# Patient Record
Sex: Female | Born: 1945 | Race: White | Hispanic: No | Marital: Married | Smoking: Never smoker
Health system: Southern US, Community
[De-identification: ages and names within clinical notes are randomized; demographics above are authoritative.]

## PROBLEM LIST (undated history)

## (undated) DIAGNOSIS — R5383 Other fatigue: Secondary | ICD-10-CM

## (undated) DIAGNOSIS — K635 Polyp of colon: Secondary | ICD-10-CM

## (undated) DIAGNOSIS — R197 Diarrhea, unspecified: Secondary | ICD-10-CM

## (undated) DIAGNOSIS — R61 Generalized hyperhidrosis: Secondary | ICD-10-CM

## (undated) DIAGNOSIS — N63 Unspecified lump in unspecified breast: Secondary | ICD-10-CM

## (undated) DIAGNOSIS — C801 Malignant (primary) neoplasm, unspecified: Secondary | ICD-10-CM

## (undated) DIAGNOSIS — R52 Pain, unspecified: Secondary | ICD-10-CM

## (undated) DIAGNOSIS — Z973 Presence of spectacles and contact lenses: Secondary | ICD-10-CM

## (undated) DIAGNOSIS — D0510 Intraductal carcinoma in situ of unspecified breast: Secondary | ICD-10-CM

## (undated) DIAGNOSIS — E785 Hyperlipidemia, unspecified: Secondary | ICD-10-CM

## (undated) DIAGNOSIS — K469 Unspecified abdominal hernia without obstruction or gangrene: Secondary | ICD-10-CM

## (undated) DIAGNOSIS — R112 Nausea with vomiting, unspecified: Secondary | ICD-10-CM

## (undated) HISTORY — DX: Presence of spectacles and contact lenses: Z97.3

## (undated) HISTORY — DX: Intraductal carcinoma in situ of unspecified breast: D05.10

## (undated) HISTORY — DX: Pain, unspecified: R52

## (undated) HISTORY — DX: Diarrhea, unspecified: R19.7

## (undated) HISTORY — DX: Hyperlipidemia, unspecified: E78.5

## (undated) HISTORY — PX: BREAST SURGERY: SHX581

## (undated) HISTORY — DX: Other fatigue: R53.83

## (undated) HISTORY — DX: Unspecified lump in unspecified breast: N63.0

## (undated) HISTORY — PX: ABDOMINAL HYSTERECTOMY: SHX81

## (undated) HISTORY — PX: OTHER SURGICAL HISTORY: SHX169

## (undated) HISTORY — DX: Nausea with vomiting, unspecified: R11.2

## (undated) HISTORY — DX: Unspecified abdominal hernia without obstruction or gangrene: K46.9

## (undated) HISTORY — DX: Polyp of colon: K63.5

## (undated) HISTORY — DX: Generalized hyperhidrosis: R61

## (undated) HISTORY — DX: Malignant (primary) neoplasm, unspecified: C80.1

## (undated) HISTORY — PX: TRIGGER FINGER RELEASE: SHX641

---

## 2010-03-11 ENCOUNTER — Ambulatory Visit: Admission: RE | Admit: 2010-03-11 | Discharge: 2010-05-16 | Payer: Self-pay | Admitting: Radiation Oncology

## 2010-04-10 ENCOUNTER — Ambulatory Visit: Payer: Self-pay | Admitting: Oncology

## 2010-04-14 LAB — COMPREHENSIVE METABOLIC PANEL
Albumin: 4.3 g/dL (ref 3.5–5.2)
BUN: 14 mg/dL (ref 6–23)
Calcium: 9.3 mg/dL (ref 8.4–10.5)
Chloride: 106 mEq/L (ref 96–112)
Creatinine, Ser: 0.71 mg/dL (ref 0.40–1.20)
Glucose, Bld: 107 mg/dL — ABNORMAL HIGH (ref 70–99)
Potassium: 4.2 mEq/L (ref 3.5–5.3)

## 2010-04-14 LAB — CBC WITH DIFFERENTIAL/PLATELET
Basophils Absolute: 0 10*3/uL (ref 0.0–0.1)
Eosinophils Absolute: 0.2 10*3/uL (ref 0.0–0.5)
HCT: 41.5 % (ref 34.8–46.6)
HGB: 14.1 g/dL (ref 11.6–15.9)
MCV: 89.5 fL (ref 79.5–101.0)
MONO%: 9.4 % (ref 0.0–14.0)
NEUT#: 2.1 10*3/uL (ref 1.5–6.5)
NEUT%: 52.7 % (ref 38.4–76.8)
RDW: 12.9 % (ref 11.2–14.5)

## 2010-05-29 ENCOUNTER — Ambulatory Visit: Payer: Self-pay | Admitting: Oncology

## 2010-06-25 LAB — COMPREHENSIVE METABOLIC PANEL
AST: 27 U/L (ref 0–37)
Albumin: 4.7 g/dL (ref 3.5–5.2)
BUN: 14 mg/dL (ref 6–23)
CO2: 27 mEq/L (ref 19–32)
Calcium: 9.5 mg/dL (ref 8.4–10.5)
Chloride: 105 mEq/L (ref 96–112)
Glucose, Bld: 81 mg/dL (ref 70–99)
Potassium: 4.3 mEq/L (ref 3.5–5.3)

## 2010-06-25 LAB — CBC WITH DIFFERENTIAL/PLATELET
Basophils Absolute: 0 10*3/uL (ref 0.0–0.1)
Eosinophils Absolute: 0.1 10*3/uL (ref 0.0–0.5)
HGB: 14.3 g/dL (ref 11.6–15.9)
MONO#: 0.5 10*3/uL (ref 0.1–0.9)
NEUT#: 2.2 10*3/uL (ref 1.5–6.5)
RDW: 12.8 % (ref 11.2–14.5)
lymph#: 1.1 10*3/uL (ref 0.9–3.3)

## 2010-07-03 ENCOUNTER — Ambulatory Visit: Payer: Self-pay | Admitting: Oncology

## 2010-10-09 ENCOUNTER — Other Ambulatory Visit: Payer: Self-pay | Admitting: Oncology

## 2010-10-09 ENCOUNTER — Encounter (HOSPITAL_BASED_OUTPATIENT_CLINIC_OR_DEPARTMENT_OTHER): Payer: BC Managed Care – PPO | Admitting: Oncology

## 2010-10-09 DIAGNOSIS — Z780 Asymptomatic menopausal state: Secondary | ICD-10-CM

## 2010-10-09 DIAGNOSIS — Z801 Family history of malignant neoplasm of trachea, bronchus and lung: Secondary | ICD-10-CM

## 2010-10-09 DIAGNOSIS — K219 Gastro-esophageal reflux disease without esophagitis: Secondary | ICD-10-CM

## 2010-10-09 DIAGNOSIS — Z853 Personal history of malignant neoplasm of breast: Secondary | ICD-10-CM

## 2010-10-09 DIAGNOSIS — Z800 Family history of malignant neoplasm of digestive organs: Secondary | ICD-10-CM

## 2010-10-09 DIAGNOSIS — C50219 Malignant neoplasm of upper-inner quadrant of unspecified female breast: Secondary | ICD-10-CM

## 2010-10-09 DIAGNOSIS — Z1231 Encounter for screening mammogram for malignant neoplasm of breast: Secondary | ICD-10-CM

## 2010-10-09 LAB — COMPREHENSIVE METABOLIC PANEL
ALT: 24 U/L (ref 0–35)
AST: 21 U/L (ref 0–37)
Albumin: 4.6 g/dL (ref 3.5–5.2)
BUN: 19 mg/dL (ref 6–23)
Calcium: 9.7 mg/dL (ref 8.4–10.5)
Chloride: 103 mEq/L (ref 96–112)
Potassium: 3.9 mEq/L (ref 3.5–5.3)
Total Protein: 7.1 g/dL (ref 6.0–8.3)

## 2010-10-09 LAB — CBC WITH DIFFERENTIAL/PLATELET
Basophils Absolute: 0 10*3/uL (ref 0.0–0.1)
EOS%: 1.7 % (ref 0.0–7.0)
HGB: 14.4 g/dL (ref 11.6–15.9)
MCH: 29.6 pg (ref 25.1–34.0)
NEUT#: 2.1 10*3/uL (ref 1.5–6.5)
RDW: 13.2 % (ref 11.2–14.5)
lymph#: 1.6 10*3/uL (ref 0.9–3.3)

## 2010-10-15 ENCOUNTER — Other Ambulatory Visit: Payer: BC Managed Care – PPO

## 2010-10-21 ENCOUNTER — Ambulatory Visit
Admission: RE | Admit: 2010-10-21 | Discharge: 2010-10-21 | Disposition: A | Discharge status: Home / Self Care | Payer: BC Managed Care – PPO | Source: Ambulatory Visit | Attending: Oncology | Admitting: Oncology

## 2010-10-21 DIAGNOSIS — Z853 Personal history of malignant neoplasm of breast: Secondary | ICD-10-CM

## 2010-10-21 DIAGNOSIS — Z780 Asymptomatic menopausal state: Secondary | ICD-10-CM

## 2011-01-22 ENCOUNTER — Other Ambulatory Visit: Payer: Self-pay | Admitting: Oncology

## 2011-01-22 ENCOUNTER — Encounter (HOSPITAL_BASED_OUTPATIENT_CLINIC_OR_DEPARTMENT_OTHER): Payer: BC Managed Care – PPO | Admitting: Oncology

## 2011-01-22 DIAGNOSIS — C50219 Malignant neoplasm of upper-inner quadrant of unspecified female breast: Secondary | ICD-10-CM

## 2011-01-22 DIAGNOSIS — Z800 Family history of malignant neoplasm of digestive organs: Secondary | ICD-10-CM

## 2011-01-22 DIAGNOSIS — K219 Gastro-esophageal reflux disease without esophagitis: Secondary | ICD-10-CM

## 2011-01-22 DIAGNOSIS — Z801 Family history of malignant neoplasm of trachea, bronchus and lung: Secondary | ICD-10-CM

## 2011-01-22 LAB — CBC WITH DIFFERENTIAL/PLATELET
BASO%: 0.4 % (ref 0.0–2.0)
Basophils Absolute: 0 10*3/uL (ref 0.0–0.1)
EOS%: 2.3 % (ref 0.0–7.0)
Eosinophils Absolute: 0.1 10*3/uL (ref 0.0–0.5)
HCT: 40.5 % (ref 34.8–46.6)
HGB: 13.9 g/dL (ref 11.6–15.9)
LYMPH%: 38.2 % (ref 14.0–49.7)
MCH: 30.9 pg (ref 25.1–34.0)
MCHC: 34.4 g/dL (ref 31.5–36.0)
MCV: 89.8 fL (ref 79.5–101.0)
MONO#: 0.4 10*3/uL (ref 0.1–0.9)
MONO%: 10.7 % (ref 0.0–14.0)
NEUT#: 2 10*3/uL (ref 1.5–6.5)
NEUT%: 48.4 % (ref 38.4–76.8)
Platelets: 213 10*3/uL (ref 145–400)
RBC: 4.52 10*6/uL (ref 3.70–5.45)
RDW: 12.7 % (ref 11.2–14.5)
WBC: 4.2 10*3/uL (ref 3.9–10.3)
lymph#: 1.6 10*3/uL (ref 0.9–3.3)

## 2011-01-22 LAB — COMPREHENSIVE METABOLIC PANEL WITH GFR
ALT: 27 U/L (ref 0–35)
AST: 23 U/L (ref 0–37)
Albumin: 4.4 g/dL (ref 3.5–5.2)
Alkaline Phosphatase: 64 U/L (ref 39–117)
BUN: 16 mg/dL (ref 6–23)
CO2: 28 meq/L (ref 19–32)
Calcium: 9.5 mg/dL (ref 8.4–10.5)
Chloride: 104 meq/L (ref 96–112)
Creatinine, Ser: 0.71 mg/dL (ref 0.50–1.10)
Glucose, Bld: 91 mg/dL (ref 70–99)
Potassium: 4.1 meq/L (ref 3.5–5.3)
Sodium: 141 meq/L (ref 135–145)
Total Bilirubin: 0.4 mg/dL (ref 0.3–1.2)
Total Protein: 6.4 g/dL (ref 6.0–8.3)

## 2011-02-03 ENCOUNTER — Ambulatory Visit (INDEPENDENT_AMBULATORY_CARE_PROVIDER_SITE_OTHER): Payer: BC Managed Care – PPO | Admitting: General Surgery

## 2011-02-03 ENCOUNTER — Encounter (INDEPENDENT_AMBULATORY_CARE_PROVIDER_SITE_OTHER): Payer: Self-pay | Admitting: General Surgery

## 2011-02-03 VITALS — BP 134/80 | HR 80 | Temp 96.8°F | Ht 61.5 in | Wt 122.0 lb

## 2011-02-03 DIAGNOSIS — R220 Localized swelling, mass and lump, head: Secondary | ICD-10-CM

## 2011-02-03 NOTE — Progress Notes (Signed)
Subjective:     Patient ID: Debra Mccarthy, female   DOB: 1946/03/26, 65 y.o.   MRN: [id number]  HPI The patient comes in with a complaint of a notch on the right upper aspect of her suprasternal notch which has been there since the Saturday following her Friday. 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 This mass has been noted since April and has enlarged in size since then. It is occasionally tender but probably is gone more tender with recurrent palpation she's had no symptoms involved with it. No prior biopsies had been done a CT scan was done along with a chest x-ray and by the report given no significant masses had been seen  Review of Systems  Constitutional: Negative.   HENT: Positive for neck pain (right suprasternal notch area).   Eyes: Negative.   Respiratory: Negative.   Cardiovascular: Negative.   Gastrointestinal: Negative.   Genitourinary: Negative.   Neurological: Negative.   Hematological: Negative.   Psychiatric/Behavioral: Negative.        Objective:   Physical Exam  Constitutional: She is oriented to person, place, and time. She appears well-developed and well-nourished.  HENT:  Head: Normocephalic and atraumatic.  Eyes: Pupils are equal, round, and reactive to light.  Neck: Normal range of motion. No rigidity. Mass (not thyroid, right suprasternal) present.    Musculoskeletal: Normal range of motion.  Neurological: She is alert and oriented to person, place, and time. She has normal reflexes.  Skin: Skin is warm and dry.  Psychiatric: She has a normal mood and affect. Her behavior is normal. Judgment normal.       Assessment:     The patient has a mobile soft mildly tender right suprasternal notch the which is amenable to biopsy however on reluctant to do so until I have had a chance to review the CT scan of her neck that was done in April of 2012. This x-ray was done outside of the  hospital system therefore I will meet with the patient and her husband to bring the disc at trial office were reviewed.  They are going on vacation this week and for about a week and I will see him back in about 2 weeks for followup and discussion of possible outpatient surgical biopsy    Plan:     I will give the patient's OCD that contains a CT scan of her neck in April. We then did have one dose available when I see the patient again in approximately 2 weeks.

## 2011-02-12 ENCOUNTER — Ambulatory Visit: Payer: BC Managed Care – PPO | Admitting: Radiation Oncology

## 2011-03-03 ENCOUNTER — Encounter (INDEPENDENT_AMBULATORY_CARE_PROVIDER_SITE_OTHER): Payer: Self-pay | Admitting: General Surgery

## 2011-03-03 ENCOUNTER — Ambulatory Visit (INDEPENDENT_AMBULATORY_CARE_PROVIDER_SITE_OTHER): Payer: BC Managed Care – PPO | Admitting: General Surgery

## 2011-03-03 DIAGNOSIS — R220 Localized swelling, mass and lump, head: Secondary | ICD-10-CM

## 2011-03-03 DIAGNOSIS — R221 Localized swelling, mass and lump, neck: Secondary | ICD-10-CM

## 2011-03-03 NOTE — Progress Notes (Signed)
HPI After the patient's last visit I was able to review the previous radiologic studies done on her chest demonstrating this suprasternal mass in from the look of it on these studies it is a benign process soft tissue process to down it does not appear to be a cyst. Possibly a lipoma or something related to the sternocleidomastoid muscle.  PE The mass in the suprasternal notch is just to the right of the notch behind the sternal head of the sternocleidomastoid muscle. It is much more prominent in the upright position and induced with the patient in the supine position. It is somewhat tender to palpation there appears to be about 1-1/2 cm in size. It is mobile.  Studiy review I reviewed the patient's previous scans demonstrate this soft tissue mass in the suprasternal notch it does not appear to be a significant process  Assessment Benign soft tissue growth in the suprasternal notch  Plan Reassess the patient in 3 months and if it has grown in size and consider excisional biopsy.

## 2011-04-13 ENCOUNTER — Other Ambulatory Visit: Payer: Self-pay | Admitting: Physician Assistant

## 2011-04-13 ENCOUNTER — Encounter (HOSPITAL_BASED_OUTPATIENT_CLINIC_OR_DEPARTMENT_OTHER): Payer: BC Managed Care – PPO | Admitting: Oncology

## 2011-04-13 DIAGNOSIS — R232 Flushing: Secondary | ICD-10-CM

## 2011-04-13 DIAGNOSIS — C50919 Malignant neoplasm of unspecified site of unspecified female breast: Secondary | ICD-10-CM

## 2011-04-13 DIAGNOSIS — Z923 Personal history of irradiation: Secondary | ICD-10-CM

## 2011-04-13 DIAGNOSIS — K219 Gastro-esophageal reflux disease without esophagitis: Secondary | ICD-10-CM

## 2011-04-13 DIAGNOSIS — Z803 Family history of malignant neoplasm of breast: Secondary | ICD-10-CM

## 2011-04-13 LAB — CBC WITH DIFFERENTIAL/PLATELET
BASO%: 0.3 % (ref 0.0–2.0)
EOS%: 2.3 % (ref 0.0–7.0)
HCT: 36.1 % (ref 34.8–46.6)
LYMPH%: 31.5 % (ref 14.0–49.7)
MCH: 31.7 pg (ref 25.1–34.0)
MCHC: 34.8 g/dL (ref 31.5–36.0)
MCV: 91.2 fL (ref 79.5–101.0)
MONO%: 9.5 % (ref 0.0–14.0)
NEUT%: 56.4 % (ref 38.4–76.8)
Platelets: 214 10*3/uL (ref 145–400)

## 2011-04-13 LAB — COMPREHENSIVE METABOLIC PANEL
ALT: 13 U/L (ref 0–35)
AST: 17 U/L (ref 0–37)
BUN: 14 mg/dL (ref 6–23)
Creatinine, Ser: 0.74 mg/dL (ref 0.50–1.10)
Total Bilirubin: 0.3 mg/dL (ref 0.3–1.2)

## 2011-05-05 ENCOUNTER — Ambulatory Visit: Payer: BC Managed Care – PPO

## 2011-05-05 ENCOUNTER — Ambulatory Visit
Admission: RE | Admit: 2011-05-05 | Discharge: 2011-05-05 | Disposition: A | Discharge status: Home / Self Care | Payer: BC Managed Care – PPO | Source: Ambulatory Visit | Attending: Oncology | Admitting: Oncology

## 2011-05-05 ENCOUNTER — Other Ambulatory Visit: Payer: Self-pay | Admitting: Oncology

## 2011-05-05 ENCOUNTER — Other Ambulatory Visit: Payer: Self-pay | Admitting: *Deleted

## 2011-05-05 DIAGNOSIS — Z1231 Encounter for screening mammogram for malignant neoplasm of breast: Secondary | ICD-10-CM

## 2011-05-18 ENCOUNTER — Encounter (INDEPENDENT_AMBULATORY_CARE_PROVIDER_SITE_OTHER): Payer: Self-pay | Admitting: General Surgery

## 2011-05-19 ENCOUNTER — Other Ambulatory Visit: Payer: Self-pay | Admitting: Oncology

## 2011-05-19 DIAGNOSIS — Z9889 Other specified postprocedural states: Secondary | ICD-10-CM

## 2011-05-19 DIAGNOSIS — Z853 Personal history of malignant neoplasm of breast: Secondary | ICD-10-CM

## 2011-05-25 ENCOUNTER — Ambulatory Visit
Admission: RE | Admit: 2011-05-25 | Discharge: 2011-05-25 | Disposition: A | Discharge status: Home / Self Care | Payer: BC Managed Care – PPO | Source: Ambulatory Visit | Attending: Oncology | Admitting: Oncology

## 2011-05-25 ENCOUNTER — Other Ambulatory Visit: Payer: Self-pay | Admitting: Oncology

## 2011-05-25 DIAGNOSIS — Z9889 Other specified postprocedural states: Secondary | ICD-10-CM

## 2011-05-25 DIAGNOSIS — Z853 Personal history of malignant neoplasm of breast: Secondary | ICD-10-CM

## 2011-05-25 DIAGNOSIS — R928 Other abnormal and inconclusive findings on diagnostic imaging of breast: Secondary | ICD-10-CM

## 2011-05-25 MED ORDER — GADOBENATE DIMEGLUMINE 529 MG/ML IV SOLN
11.0000 mL | Freq: Once | INTRAVENOUS | Status: AC | PRN
  Administered 2011-05-25: 11 mL via INTRAVENOUS

## 2011-05-26 ENCOUNTER — Other Ambulatory Visit: Payer: Self-pay | Admitting: Oncology

## 2011-05-26 ENCOUNTER — Encounter: Payer: Self-pay | Admitting: Oncology

## 2011-05-26 ENCOUNTER — Telehealth: Payer: Self-pay | Admitting: *Deleted

## 2011-05-26 DIAGNOSIS — D0510 Intraductal carcinoma in situ of unspecified breast: Secondary | ICD-10-CM

## 2011-05-26 HISTORY — DX: Intraductal carcinoma in situ of unspecified breast: D05.10

## 2011-05-26 NOTE — Telephone Encounter (Signed)
ok 

## 2011-05-26 NOTE — Telephone Encounter (Signed)
Per MD, no need to see pt today. Pt will have an ultrasound 05/27/11 at 1000. MD will follow up after scan. Pt has been notifed and verbalizes understanding

## 2011-05-26 NOTE — Telephone Encounter (Signed)
Made patient appointment for 06-15-2011 at 2:00

## 2011-05-26 NOTE — Telephone Encounter (Signed)
Per MD, pt has been notified that she is to call this desk for appt with MD to discuss scan

## 2011-05-28 ENCOUNTER — Ambulatory Visit
Admission: RE | Admit: 2011-05-28 | Discharge: 2011-05-28 | Disposition: A | Discharge status: Home / Self Care | Payer: BC Managed Care – PPO | Source: Ambulatory Visit | Attending: Oncology | Admitting: Oncology

## 2011-05-28 ENCOUNTER — Ambulatory Visit: Payer: BC Managed Care – PPO

## 2011-05-28 DIAGNOSIS — R928 Other abnormal and inconclusive findings on diagnostic imaging of breast: Secondary | ICD-10-CM

## 2011-06-12 ENCOUNTER — Encounter: Payer: Self-pay | Admitting: *Deleted

## 2011-06-12 ENCOUNTER — Telehealth: Payer: Self-pay | Admitting: *Deleted

## 2011-06-12 NOTE — Telephone Encounter (Signed)
Pt called to advised " I would like to reschedule appt 11/26 for another time as I'm [facility] most recent scan to the ones I had at [facility]. They said the marker is so close to the spot they want to compare from the last X-rays. [facility] is supposed to send them the X-rays so that's what I'm waiting on. They are going to call me once they get them and tell me. Until then I dont' think I need to see Dr. Welton Flakes so I'd like to reschedule my appt until after the X-rays are compared. I will call you and let you know." Informed pt I would give Dr. Welton Flakes the message on Monday as she is out of office today.

## 2011-06-13 ENCOUNTER — Telehealth: Payer: Self-pay | Admitting: Oncology

## 2011-06-13 NOTE — Telephone Encounter (Signed)
Cancelled appt per Mitchell Heir, per pt rqst

## 2011-06-15 ENCOUNTER — Ambulatory Visit: Payer: BC Managed Care – PPO | Admitting: Oncology

## 2011-06-28 ENCOUNTER — Other Ambulatory Visit: Payer: Self-pay | Admitting: Oncology

## 2011-06-28 DIAGNOSIS — D0510 Intraductal carcinoma in situ of unspecified breast: Secondary | ICD-10-CM

## 2011-06-29 ENCOUNTER — Other Ambulatory Visit: Payer: Self-pay

## 2011-06-29 DIAGNOSIS — D0510 Intraductal carcinoma in situ of unspecified breast: Secondary | ICD-10-CM

## 2011-06-29 MED ORDER — TAMOXIFEN CITRATE 20 MG PO TABS: 20.0000 mg | ORAL_TABLET | Freq: Every day | ORAL | Status: AC

## 2011-06-30 ENCOUNTER — Other Ambulatory Visit: Payer: Self-pay | Admitting: Oncology

## 2011-06-30 ENCOUNTER — Telehealth: Payer: Self-pay | Admitting: *Deleted

## 2011-06-30 DIAGNOSIS — R928 Other abnormal and inconclusive findings on diagnostic imaging of breast: Secondary | ICD-10-CM

## 2011-06-30 NOTE — Telephone Encounter (Signed)
Called received from Dr. Chilton Si at [facility] for Dr. Welton Flakes to write an order - Ultrasound guided right breast mass biopsy on pt. To be faxed to [phone]. Pt has appt with Dr. Chilton Si on 12/21.

## 2011-06-30 NOTE — Telephone Encounter (Signed)
I have ordered the biopsy

## 2011-07-10 ENCOUNTER — Ambulatory Visit
Admission: RE | Admit: 2011-07-10 | Discharge: 2011-07-10 | Disposition: A | Discharge status: Home / Self Care | Payer: BC Managed Care – PPO | Source: Ambulatory Visit | Attending: Oncology | Admitting: Oncology

## 2011-07-10 DIAGNOSIS — R928 Other abnormal and inconclusive findings on diagnostic imaging of breast: Secondary | ICD-10-CM

## 2011-07-10 DIAGNOSIS — D0510 Intraductal carcinoma in situ of unspecified breast: Secondary | ICD-10-CM

## 2011-07-20 ENCOUNTER — Telehealth: Payer: Self-pay | Admitting: *Deleted

## 2011-07-20 MED ORDER — VENLAFAXINE HCL ER 150 MG PO CP24: 150.0000 mg | ORAL_CAPSULE | Freq: Every day | ORAL | Status: AC

## 2011-07-20 NOTE — Telephone Encounter (Signed)
VO FROM MD TO REFILL PT'S EFFEXOR. REQUEST SENT TO PHARMACY

## 2011-07-20 NOTE — Telephone Encounter (Signed)
Notified pt no evidence of recurrent or new cancer on recent biopsy. Pt request 90 day refill for Effexor XR 150 mg daily. WIll review with MD

## 2011-07-20 NOTE — Telephone Encounter (Signed)
Message copied by Cooper Render on Mon Jul 20, 2011  1:14 PM ------      Message from: Victorino December      Created: Wed Jul 15, 2011  1:16 PM       Call patient: no evidence of recurrent or new cancer on recent breast biopsy

## 2011-07-28 ENCOUNTER — Encounter (INDEPENDENT_AMBULATORY_CARE_PROVIDER_SITE_OTHER): Payer: Self-pay | Admitting: General Surgery

## 2011-07-28 ENCOUNTER — Ambulatory Visit (INDEPENDENT_AMBULATORY_CARE_PROVIDER_SITE_OTHER): Payer: BC Managed Care – PPO | Admitting: General Surgery

## 2011-07-28 VITALS — BP 114/80 | HR 76 | Temp 97.8°F | Resp 12 | Ht 62.0 in | Wt 125.6 lb

## 2011-07-28 DIAGNOSIS — R221 Localized swelling, mass and lump, neck: Secondary | ICD-10-CM

## 2011-07-28 DIAGNOSIS — R220 Localized swelling, mass and lump, head: Secondary | ICD-10-CM

## 2011-07-28 NOTE — Progress Notes (Signed)
HPI The patient comes in for a recheck of her suprasternal notch mass.  PE On examination today her right stone head of the clavicle appears to be prominent. There is no tenderness. Her breast examinations and an aspirate  Studiy review None.  Assessment Indolent suprasternal and possible right sub-clavicular mass.  Plan Return to see me on a p.r.n. basis.

## 2011-10-16 ENCOUNTER — Other Ambulatory Visit: Payer: Self-pay | Admitting: Physician Assistant

## 2011-11-10 ENCOUNTER — Other Ambulatory Visit: Payer: Self-pay | Admitting: Physician Assistant

## 2011-12-01 ENCOUNTER — Other Ambulatory Visit: Payer: Self-pay | Admitting: Obstetrics and Gynecology

## 2012-01-22 ENCOUNTER — Other Ambulatory Visit: Payer: Self-pay | Admitting: Medical Oncology

## 2012-01-22 ENCOUNTER — Telehealth: Payer: Self-pay | Admitting: Medical Oncology

## 2012-01-22 ENCOUNTER — Telehealth: Payer: Self-pay | Admitting: *Deleted

## 2012-01-22 NOTE — Telephone Encounter (Signed)
Per MD, Dr. Welton Flakes would like to see patient next week.  Informed patient that schedulers would be calling with exact date and time of appointment.  "There is no one else that could see me before next week."  Advised patient that Dr. Welton Flakes would like to see her and that her next available appointment was towards the end of next week.  Patient expressed understanding, no further questions at this time.  Instructed patient to call clinic if any questions or concerns arise before appointment.

## 2012-01-22 NOTE — Telephone Encounter (Signed)
Have patient see me at next available next week

## 2012-01-22 NOTE — Telephone Encounter (Signed)
patient confirmed over the phone the new date and time on 01-29-2012 at 9:30am

## 2012-01-22 NOTE — Telephone Encounter (Signed)
Pt Debra Mccarthy that she "is experiencing a lot of pain in her right breast where she had her lumpectomy done.  I don't know if I need to see Dr. Welton Flakes, go for a MRI or a mammogram?"    Pt canceled last appointment 06/15/2011, waiting on scan comparison from [facility].  Has not called to reschedule this appointment  Will review with MD

## 2012-01-29 ENCOUNTER — Ambulatory Visit (HOSPITAL_BASED_OUTPATIENT_CLINIC_OR_DEPARTMENT_OTHER): Payer: BC Managed Care – PPO | Admitting: Oncology

## 2012-01-29 ENCOUNTER — Encounter: Payer: Self-pay | Admitting: Oncology

## 2012-01-29 ENCOUNTER — Telehealth: Payer: Self-pay | Admitting: *Deleted

## 2012-01-29 VITALS — BP 137/78 | HR 99 | Temp 98.4°F | Ht 62.0 in | Wt 125.8 lb

## 2012-01-29 DIAGNOSIS — D0590 Unspecified type of carcinoma in situ of unspecified breast: Secondary | ICD-10-CM

## 2012-01-29 DIAGNOSIS — D0510 Intraductal carcinoma in situ of unspecified breast: Secondary | ICD-10-CM

## 2012-01-29 NOTE — Patient Instructions (Addendum)
1. Please continue getting mammograms and MRI of the breasts as you have been  2. I will refer you to our genetic counselor.  3. I will see you back in December 2013

## 2012-01-29 NOTE — Progress Notes (Signed)
OFFICE PROGRESS NOTE  CC  Kirke Corin, DO 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  PRIOR THERAPY:  #1 patient had a lumpectomy on 01/16/2010 for a ductal carcinoma in situ of the right breast.  #2 she then went on to receive radiation therapy to the right breast which she tolerated well.  #3 she was then begun on tamoxifen 20 mg daily. For hot flashes she is on Effexor XR at nighttime.  #3 patient has had BRCA1 and BRCA2 testing performed which were negative but it is on certain if she ever had BART testing or any other type of evaluation.  CURRENT THERAPY:Tamoxifen 20 mg daily  INTERVAL HISTORY: Debra Mccarthy 66 y.o. female returns for Followup visit today. She was last seen by Colman Cater in the survivor clinic. Patient overall has done well she recently called [place] a with complaints of tenderness in her right breast. Since patient has not been seen in quite some time I have the patient come in today. She now tells me that the tenderness in the right breast has dissipated. She apparently had been doing a lot of gardening and pulling weeds and this pain could possibly be due to musculoskeletal strain. She today has full range of motion she has no tenderness no nipple discharge no other complaints. She certainly does have a lot of anxiety still as her sister recently died from breast cancer in 09-29-2011. I do think that she is still recuperating from.and she may be very susceptible to anything that may happen with her at this point. She has no vaginal bleeding no headaches double vision blurring of vision no shortness of breath or chest pains no swelling in her legs. Remainder of the 10 point review of systems is negative.  MEDICAL HISTORY: Past Medical History  Diagnosis Date  . Night sweats   . Fatigue   . Cancer     skin  . Benign breast  lumps   . Hyperlipidemia   . Hernia   . Nausea & vomiting   . Diarrhea   . Colon polyp   . Wears glasses   . Pain     right arm  . DCIS (ductal carcinoma in situ) of breast 05/26/2011    ALLERGIES:  is allergic to compazine; penicillins; and sulfa antibiotics.  MEDICATIONS:  Current Outpatient Prescriptions  Medication Sig Dispense Refill  . Biotin 1000 MCG tablet Take 1,000 mcg by mouth 3 (three) times daily.      Marland Kitchen doxepin (SINEQUAN) 10 MG capsule Take 10 mg by mouth.      . gabapentin (NEURONTIN) 100 MG capsule TAKE ONE CAPSULE AT BEDTIME MAY INCREASE UP TO 3 CAPSULES AT BEDTIME.  20 capsule  0  . omeprazole (PRILOSEC) 20 MG capsule Take 20 mg by mouth 2 (two) times daily.       . simvastatin (ZOCOR) 40 MG tablet daily.       . tamoxifen (NOLVADEX) 20 MG tablet Take 1 tablet (20 mg total) by mouth daily.  90 tablet  0  . venlafaxine (EFFEXOR-XR) 150 MG 24 hr capsule Take 1 capsule (150 mg total) by mouth daily.  90 capsule  3  . azithromycin (ZITHROMAX) 250 MG tablet       . CHERATUSSIN AC 100-10 MG/5ML syrup       . ondansetron (ZOFRAN-ODT) 4 MG disintegrating tablet       .  promethazine (PHENERGAN) 25 MG tablet         SURGICAL HISTORY:  Past Surgical History  Procedure Date  . Abdominal hysterectomy   . Mortans neuroma   . Trigger finger release   . Breast surgery     lumpectomy    REVIEW OF SYSTEMS:  Pertinent items are noted in HPI.   PHYSICAL EXAMINATION: General appearance: alert, cooperative and appears stated age Neck: no adenopathy, no carotid bruit, no JVD, supple, symmetrical, trachea midline and thyroid not enlarged, symmetric, no tenderness/mass/nodules Lymph nodes: Cervical, supraclavicular, and axillary nodes normal. Resp: clear to auscultation bilaterally and normal percussion bilaterally Back: symmetric, no curvature. ROM normal. No CVA tenderness. Cardio: regular rate and rhythm, S1, S2 normal, no murmur, click, rub or gallop GI: soft, non-tender;  bowel sounds normal; no masses,  no organomegaly Extremities: extremities normal, atraumatic, no cyanosis or edema Neurologic: Grossly normal Bilateral breast exam right breast reveals well-healed incisional scar no masses nipple discharge or skin changes. Left breast no masses or nipple discharge. ECOG PERFORMANCE STATUS: 0 - Asymptomatic  Blood pressure 137/78, pulse 99, temperature 98.4 F (36.9 C), temperature source Oral, height 5\' 2"  (1.575 m), weight 125 lb 12.8 oz (57.063 kg).  LABORATORY DATA: Lab Results  Component Value Date   WBC 3.6* 04/13/2011   HGB 12.6 04/13/2011   HCT 36.1 04/13/2011   MCV 91.2 04/13/2011   PLT 214 04/13/2011      Chemistry      Component Value Date/Time   NA 141 04/13/2011 1339   NA 141 04/13/2011 1339   K 4.2 04/13/2011 1339   K 4.2 04/13/2011 1339   CL 105 04/13/2011 1339   CL 105 04/13/2011 1339   CO2 26 04/13/2011 1339   CO2 26 04/13/2011 1339   BUN 14 04/13/2011 1339   BUN 14 04/13/2011 1339   CREATININE 0.74 04/13/2011 1339   CREATININE 0.74 04/13/2011 1339      Component Value Date/Time   CALCIUM 9.0 04/13/2011 1339   CALCIUM 9.0 04/13/2011 1339   ALKPHOS 64 04/13/2011 1339   ALKPHOS 64 04/13/2011 1339   AST 17 04/13/2011 1339   AST 17 04/13/2011 1339   ALT 13 04/13/2011 1339   ALT 13 04/13/2011 1339   BILITOT 0.3 04/13/2011 1339   BILITOT 0.3 04/13/2011 1339       RADIOGRAPHIC STUDIES:  No results found.  ASSESSMENT: 66 year old female with  #1 DCIS of the right breast status post lumpectomy followed by radiation and now on tamoxifen 20 mg since December 2011. A total of 5 years of therapy is planned.  #2 strong family history of malignancies. She has had BRCA1 and BRCA2 testing performed but no BARThas been done or any other panels. I have recommended that patient been referred to our genetic counselor for counseling as well as possible testing.   PLAN:   #1 patient will continue tamoxifen she is overall tolerating it very well. She has  no complaints from it.  #2 prefer to genetic counseling to [facility] Cos.  #3 patient will continue to receive mammograms and MRIs as scheduled.  #4 I will plan on seeing patient back in November December 2013.   All questions were answered. The patient knows to call the clinic with any problems, questions or concerns. We can certainly see the patient much sooner if necessary.  I spent 30 minutes counseling the patient face to face. The total time spent in the appointment was 30 minutes.   Drue Second, MD  Medical/Oncology [facility] [phone] (beeper) [phone] (Office)  01/29/2012, 10:01 AM

## 2012-01-29 NOTE — Telephone Encounter (Signed)
Gave patient appointment for genetics counseling appointment gave patient appointment for 07-05-2012 starting at 10:30am printed out calendar and gave to the patient

## 2012-03-09 ENCOUNTER — Telehealth: Payer: Self-pay | Admitting: *Deleted

## 2012-03-09 NOTE — Telephone Encounter (Signed)
patient called and cancelled appointment

## 2012-03-10 ENCOUNTER — Other Ambulatory Visit: Payer: BC Managed Care – PPO | Admitting: Lab

## 2012-03-10 ENCOUNTER — Encounter: Payer: BC Managed Care – PPO | Admitting: Genetic Counselor

## 2012-03-11 ENCOUNTER — Other Ambulatory Visit: Payer: Self-pay | Admitting: Obstetrics and Gynecology

## 2012-03-11 DIAGNOSIS — N644 Mastodynia: Secondary | ICD-10-CM

## 2012-03-15 ENCOUNTER — Ambulatory Visit
Admission: RE | Admit: 2012-03-15 | Discharge: 2012-03-15 | Disposition: A | Discharge status: Home / Self Care | Payer: BC Managed Care – PPO | Source: Ambulatory Visit | Attending: Obstetrics and Gynecology | Admitting: Obstetrics and Gynecology

## 2012-03-15 DIAGNOSIS — N644 Mastodynia: Secondary | ICD-10-CM

## 2012-03-22 ENCOUNTER — Other Ambulatory Visit: Payer: BC Managed Care – PPO

## 2012-04-13 ENCOUNTER — Other Ambulatory Visit: Payer: Self-pay | Admitting: Obstetrics and Gynecology

## 2012-04-13 DIAGNOSIS — Z9889 Other specified postprocedural states: Secondary | ICD-10-CM

## 2012-04-13 DIAGNOSIS — Z853 Personal history of malignant neoplasm of breast: Secondary | ICD-10-CM

## 2012-04-26 ENCOUNTER — Other Ambulatory Visit: Payer: Self-pay | Admitting: Obstetrics and Gynecology

## 2012-04-26 DIAGNOSIS — Z803 Family history of malignant neoplasm of breast: Secondary | ICD-10-CM

## 2012-04-26 DIAGNOSIS — Z853 Personal history of malignant neoplasm of breast: Secondary | ICD-10-CM

## 2012-04-26 DIAGNOSIS — Z9889 Other specified postprocedural states: Secondary | ICD-10-CM

## 2012-05-17 ENCOUNTER — Ambulatory Visit
Admission: RE | Admit: 2012-05-17 | Discharge: 2012-05-17 | Disposition: A | Discharge status: Home / Self Care | Payer: BC Managed Care – PPO | Source: Ambulatory Visit | Attending: Obstetrics and Gynecology | Admitting: Obstetrics and Gynecology

## 2012-05-17 DIAGNOSIS — Z853 Personal history of malignant neoplasm of breast: Secondary | ICD-10-CM

## 2012-05-17 DIAGNOSIS — Z9889 Other specified postprocedural states: Secondary | ICD-10-CM

## 2012-05-20 ENCOUNTER — Telehealth: Payer: Self-pay | Admitting: Oncology

## 2012-05-20 NOTE — Telephone Encounter (Signed)
Pt called to cancel her dec appts. Will call back to r/s.

## 2012-05-24 ENCOUNTER — Other Ambulatory Visit: Payer: BC Managed Care – PPO

## 2012-07-05 ENCOUNTER — Other Ambulatory Visit: Payer: BC Managed Care – PPO | Admitting: Lab

## 2012-07-05 ENCOUNTER — Ambulatory Visit: Payer: BC Managed Care – PPO | Admitting: Oncology

## 2012-08-01 ENCOUNTER — Telehealth: Payer: Self-pay | Admitting: Oncology

## 2012-08-01 NOTE — Telephone Encounter (Signed)
Faxed pt most recent medical records to Dr. Jeffie Pollock  [facility]

## 2013-11-14 ENCOUNTER — Ambulatory Visit: Payer: Medicare Other | Admitting: Neurology

## 2013-11-23 ENCOUNTER — Encounter: Payer: Self-pay | Admitting: Neurology

## 2013-11-23 ENCOUNTER — Ambulatory Visit (INDEPENDENT_AMBULATORY_CARE_PROVIDER_SITE_OTHER): Payer: Medicare Other | Admitting: Neurology

## 2013-11-23 ENCOUNTER — Encounter (INDEPENDENT_AMBULATORY_CARE_PROVIDER_SITE_OTHER): Payer: Self-pay

## 2013-11-23 VITALS — BP 117/68 | HR 76 | Ht 61.0 in | Wt 123.0 lb

## 2013-11-23 DIAGNOSIS — R220 Localized swelling, mass and lump, head: Secondary | ICD-10-CM

## 2013-11-23 DIAGNOSIS — R079 Chest pain, unspecified: Secondary | ICD-10-CM

## 2013-11-23 DIAGNOSIS — R251 Tremor, unspecified: Secondary | ICD-10-CM | POA: Insufficient documentation

## 2013-11-23 DIAGNOSIS — R221 Localized swelling, mass and lump, neck: Secondary | ICD-10-CM

## 2013-11-23 DIAGNOSIS — R259 Unspecified abnormal involuntary movements: Secondary | ICD-10-CM

## 2013-11-23 NOTE — Progress Notes (Signed)
PATIENT: Debra Mccarthy DOB: Sep 21, 1945  HISTORICAL  Debra Mccarthy is a 68 year old right-handed female, accompanied by her husband, referred by her primary care physician Dr. Melford Aase for evaluation of bilateral hands tremor, occasionally restless leg.  She had past medical history of right-sided breast cancer in situ, first one in 2001, most recent 2011, was treated at [facility], had right lumpectomy, followed by radiation therapy, but not chemotherapy  Over the past few months, since November 2014, she noticed episodes of bilateral hands tremor, inside tremor, shakiness sensation, each last episode last less than 30 minutes, no passing out, no swallowing difficulty no dysarthria, no lateralized motor or sensory deficit  She seems to have mild improvement in recent few weeks, especially after she was getting treatment ferrous sulfate for iron deficiency anemia, which was noticed recently in her yearly checkup in April 20s 2015, laboratory showed anemia, hemoglobin 10, per patient, rest of the laboratory evaluations, A1c 5 point 9, normal CMP, A35, folic acid,  She presented to urgent care in early April 2015, she suffered bronchitis, 1 day she presented with acute right-sided chest pain, was noticed to have right pleural effusion, not cardiac etiology found  She denies dark stool, no diarrhea, no gait difficulty,  She was able to exercise regularly without much difficulty, she has been complaining of swelling mass at the right sternal clavicular joints, had evaluation in the past one year, no definite etiology found,   REVIEW OF SYSTEMS: Full 14 system review of systems performed and notable only for anemia, memory loss, tremor, restless leg, not enough sleep  ALLERGIES: Allergies  Allergen Reactions  . Compazine   . Prochlorperazine     Loss of use of facial muscle  . Penicillins Rash  . Sulfa Antibiotics Rash    HOME MEDICATIONS: Current Outpatient Prescriptions on File Prior  to Visit  Medication Sig Dispense Refill  . Biotin 1000 MCG tablet Take 1,000 mcg by mouth 3 (three) times daily.      Marland Kitchen omeprazole (PRILOSEC) 20 MG capsule Take 20 mg by mouth 2 (two) times daily.       . simvastatin (ZOCOR) 40 MG tablet daily.       . tamoxifen (NOLVADEX) 20 MG tablet Take 1 tablet (20 mg total) by mouth daily.  90 tablet  0  . venlafaxine (EFFEXOR-XR) 150 MG 24 hr capsule Take 1 capsule (150 mg total) by mouth daily.  90 capsule  3   No current facility-administered medications on file prior to visit.    PAST MEDICAL HISTORY: Past Medical History  Diagnosis Date  . Night sweats   . Fatigue   . Cancer     skin  . Benign breast lumps   . Hyperlipidemia   . Hernia   . Nausea & vomiting   . Diarrhea   . Colon polyp   . Wears glasses   . Pain     right arm  . DCIS (ductal carcinoma in situ) of breast 05/26/2011    PAST SURGICAL HISTORY: Past Surgical History  Procedure Laterality Date  . Abdominal hysterectomy    . Mortans neuroma    . Trigger finger release    . Breast surgery      lumpectomy    FAMILY HISTORY: Family History  Problem Relation Age of Onset  . Cancer Mother     breast   . Cancer Father     lung and prostate  . Cancer Sister     breast  . Diabetes  Sister     SOCIAL HISTORY:  History   Social History  . Marital Status: Married    Spouse Name: Antony Haste    Number of Children: 1  . Years of Education: 12   Occupational History  . retired    Social History Main Topics  . Smoking status: Never Smoker   . Smokeless tobacco: Never Used  . Alcohol Use: 0.5 oz/week    1 drink(s) per week     Comment: OCC  . Drug Use: No  . Sexual Activity: Yes   Other Topics Concern  . Not on file   Social History Narrative   Patient lives at home with her husband Antony Haste).    Patient is a Materials engineer.   High school education.   Right handed.   Caffeine - Yes      PHYSICAL EXAM   Filed Vitals:   11/23/13 1310  BP: 117/68  Pulse:  76  Height: 5\' 1"  (1.549 m)  Weight: 123 lb (55.792 kg)    Not recorded    Body mass index is 23.25 kg/(m^2).   Generalized: In no acute distress  Neck: Supple, no carotid bruits   Cardiac: Regular rate rhythm  Pulmonary: Clear to auscultation bilaterally  Musculoskeletal: No deformity, mild tenderness, swelling of right sternal clavicular joints, tenderness at right sternal costal joints. There was no mass palpated bilateral breast, well-healed right breast incision,  Neurological examination  Mentation: Alert oriented to time, place, history taking, and causual conversation  Cranial nerve II-XII: Pupils were equal round reactive to light. Extraocular movements were full.  Visual field were full on confrontational test. Bilateral fundi were sharp.  Facial sensation and strength were normal. Hearing was intact to finger rubbing bilaterally. Uvula tongue midline.  Head turning and shoulder shrug and were normal and symmetric.Tongue protrusion into cheek strength was normal.  Motor: Normal tone, bulk and strength.  Sensory: Intact to fine touch, pinprick, preserved vibratory sensation, and proprioception at toes.  Coordination: Normal finger to nose, heel-to-shin bilaterally there was no truncal ataxia  Gait: Rising up from seated position without assistance, normal stance, without trunk ataxia, moderate stride, good arm swing, smooth turning, able to perform tiptoe, and heel walking without difficulty.   Romberg signs: Negative  Deep tendon reflexes: Brachioradialis 2/2, biceps 2/2, triceps 2/2, patellar 2/2, Achilles 2/2, plantar responses were flexor bilaterally.   DIAGNOSTIC DATA (LABS, IMAGING, TESTING) - I reviewed patient records, labs, notes, testing and imaging myself where available.  Lab Results  Component Value Date   WBC 3.6* 04/13/2011   HGB 12.6 04/13/2011   HCT 36.1 04/13/2011   MCV 91.2 04/13/2011   PLT 214 04/13/2011      Component Value Date/Time   NA  141 04/13/2011 1339   K 4.2 04/13/2011 1339   CL 105 04/13/2011 1339   CO2 26 04/13/2011 1339   GLUCOSE 69* 04/13/2011 1339   BUN 14 04/13/2011 1339   CREATININE 0.74 04/13/2011 1339   CALCIUM 9.0 04/13/2011 1339   PROT 6.7 04/13/2011 1339   ALBUMIN 4.1 04/13/2011 1339   AST 17 04/13/2011 1339   ALT 13 04/13/2011 1339   ALKPHOS 64 04/13/2011 1339   BILITOT 0.3 04/13/2011 1339   ASSESSMENT AND PLAN  Debra Mccarthy is a 68 y.o. female complains of  right-sided chest pain, was noted to have anemia, right pleural effusion, history of breast cancer, most recent treatment was 2011, had radiation therapy, but never had chemotherapy, now complains of intermittent episodes of  bilateral hands tremor, inside shaky sensation, can be related to her anemia  I will proceed with CT chest with contrast, I will call her laboratory result, I will also check TSH,  She is to continue followup with her primary care physician in June 2015  Marcial Pacas, M.D. Ph.D.  [facility] [address] [place], [place] [address] [phone]

## 2013-11-24 LAB — THYROID PANEL WITH TSH
Free Thyroxine Index: 1.7 (ref 1.2–4.9)
T3 Uptake Ratio: 20 % — ABNORMAL LOW (ref 24–39)
T4, Total: 8.6 ug/dL (ref 4.5–12.0)
TSH: 3.17 u[IU]/mL (ref 0.450–4.500)

## 2013-11-24 NOTE — Progress Notes (Signed)
Quick Note:  Please Caucasian, mildly decreased T3 uptake ratio of unknown clinical significance, otherwise normal thyroid function test ______

## 2013-11-29 ENCOUNTER — Ambulatory Visit
Admission: RE | Admit: 2013-11-29 | Discharge: 2013-11-29 | Disposition: A | Discharge status: Home / Self Care | Payer: Medicare Other | Source: Ambulatory Visit | Attending: Neurology | Admitting: Neurology

## 2013-11-29 DIAGNOSIS — R079 Chest pain, unspecified: Secondary | ICD-10-CM

## 2013-11-29 DIAGNOSIS — R251 Tremor, unspecified: Secondary | ICD-10-CM

## 2013-11-29 MED ORDER — IOHEXOL 300 MG/ML  SOLN
75.0000 mL | Freq: Once | INTRAMUSCULAR | Status: AC | PRN
  Administered 2013-11-29: 75 mL via INTRAVENOUS

## 2013-12-01 ENCOUNTER — Telehealth: Payer: Self-pay | Admitting: Neurology

## 2013-12-01 NOTE — Progress Notes (Signed)
Quick Note:  I called pt and relayed the results. T3 mildly decreased, is of unknown clinical significance, other wise normal thyroid function test. Released to mychart. Will fax ovf note and labs to Dr. Anastasia Pall, [phone] for his information. ______

## 2013-12-01 NOTE — Telephone Encounter (Signed)
I failed to reach her, left message to her and her husband cell phone. I have faxed ct chest result to her pcp Dr. Melford Aase,   Hinton Dyer, Please mail a copy of her chest ct report and lab report to her home.

## 2013-12-05 NOTE — Telephone Encounter (Signed)
Mailed  Copy of MRI and Labs.

## 2015-03-09 IMAGING — CT CT CHEST W/ CM
2 of 3 series · 15 of 36 positions shown, 18 images · IV contrast (75CC OMNI 300)
Comparison: No comparison.

CLINICAL DATA: Right breast cancer.  Right anterior chest pain.

EXAM:
CT CHEST WITH CONTRAST
TECHNIQUE: Multidetector CT imaging of the chest was performed during
intravenous contrast administration.
CONTRAST:  75mL OMNIPAQUE IOHEXOL 300 MG/ML  SOLN

[Series 2: chest with · axial · 0.70mm/px · z∈[-318,-53]mm · 12 of 63 slices shown, 15 images]
[im 5/63  mediastinal]
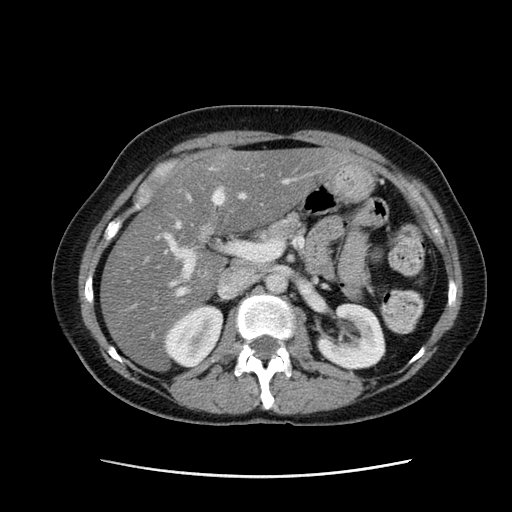
[im 5/63  lung]
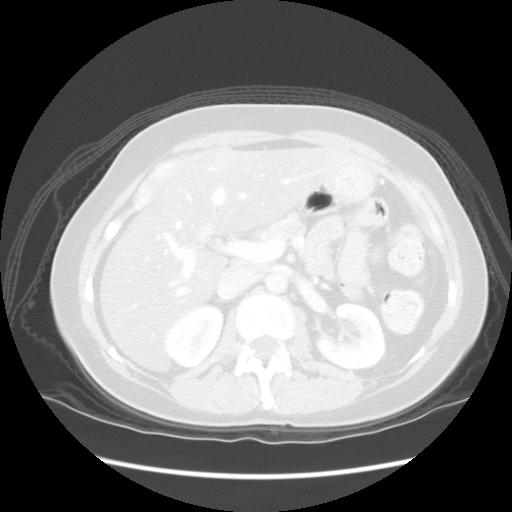
[im 10/63  lung]
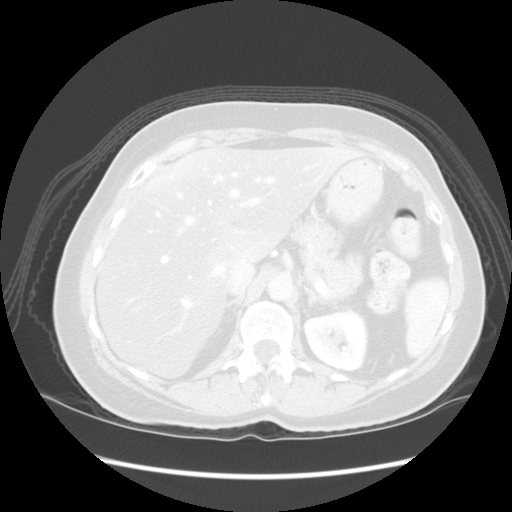
[im 14/63  lung]
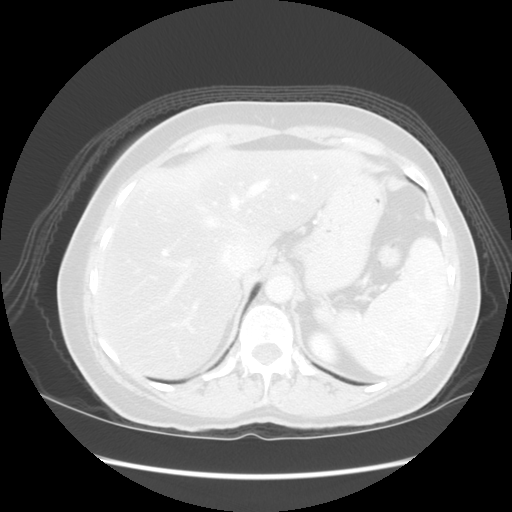
[im 19/63  lung]
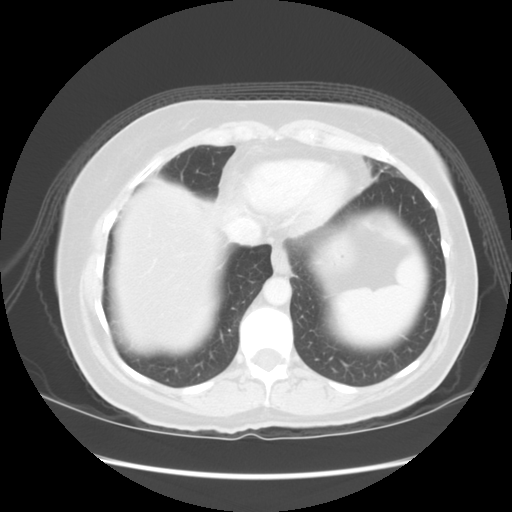
[im 23/63  mediastinal]
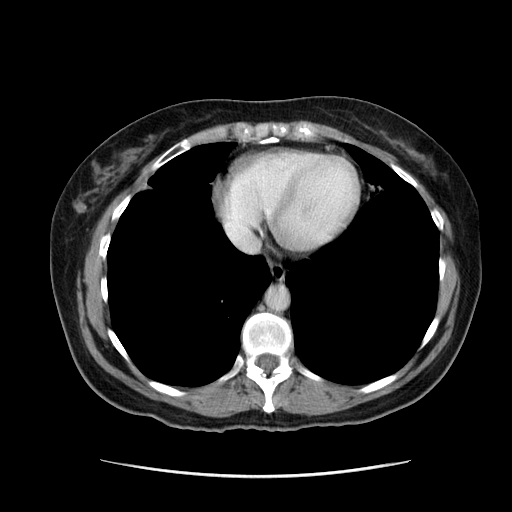
[im 23/63  lung]
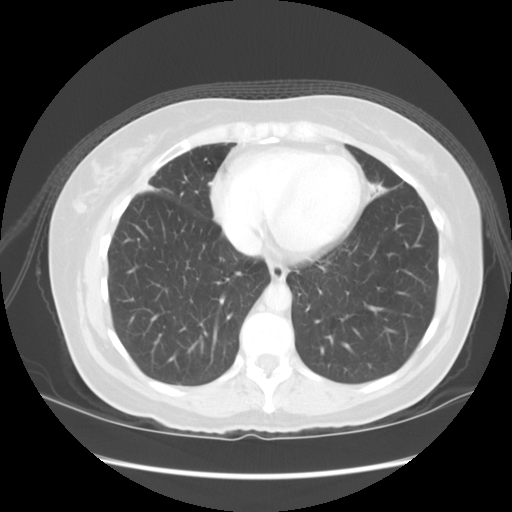
[im 28/63  lung]
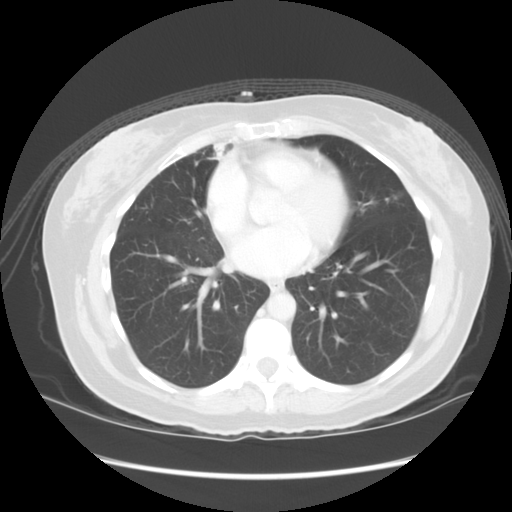
[im 35/63  lung]
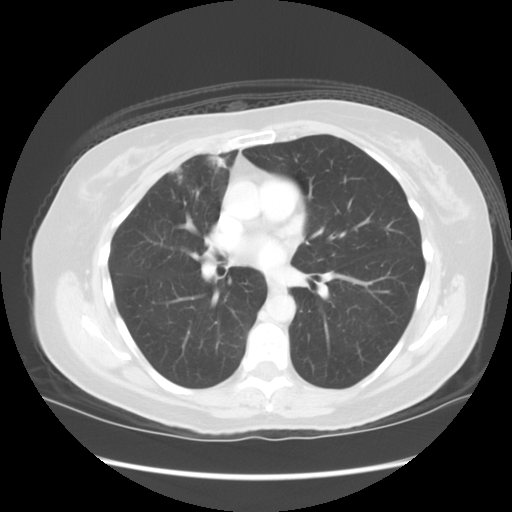
[im 40/63  lung]
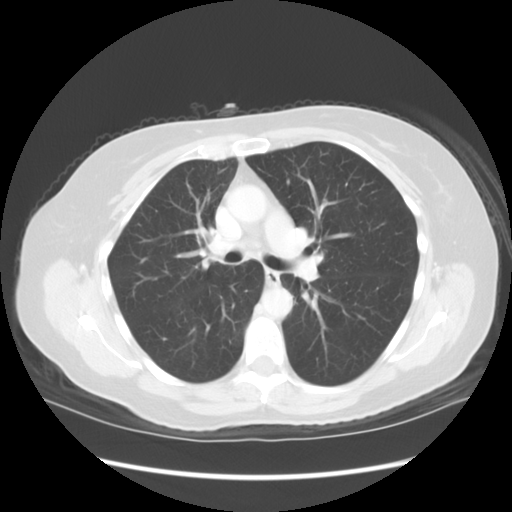
[im 44/63  mediastinal]
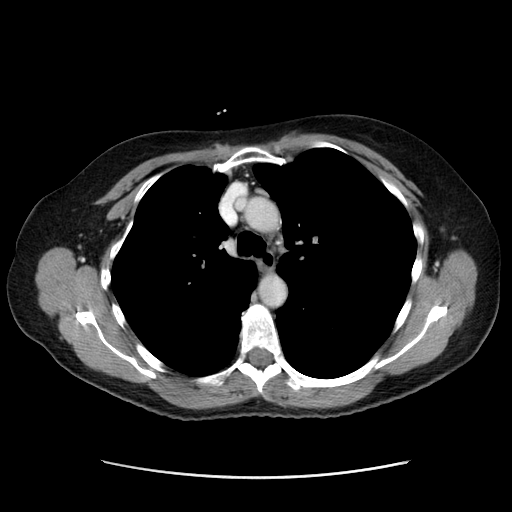
[im 44/63  lung]
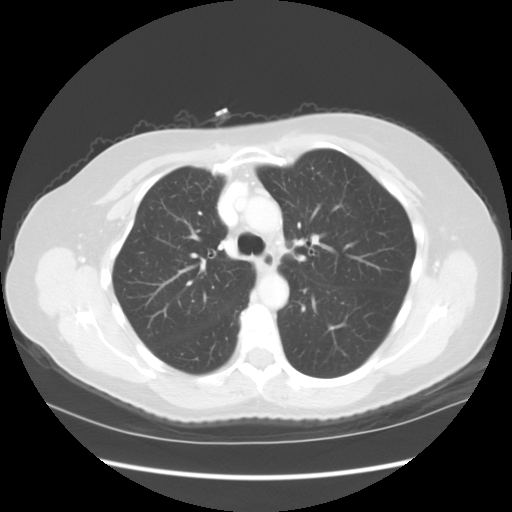
[im 49/63  lung]
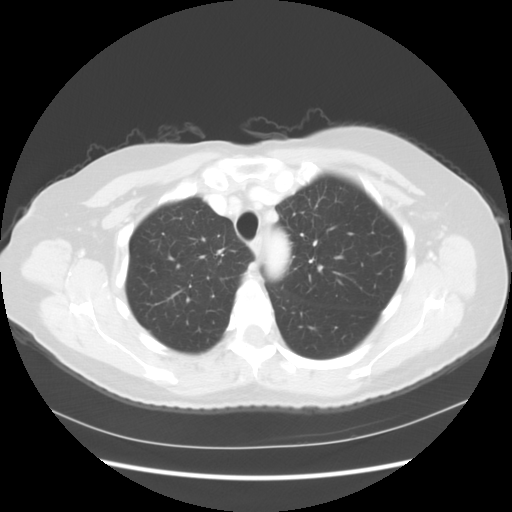
[im 53/63  lung]
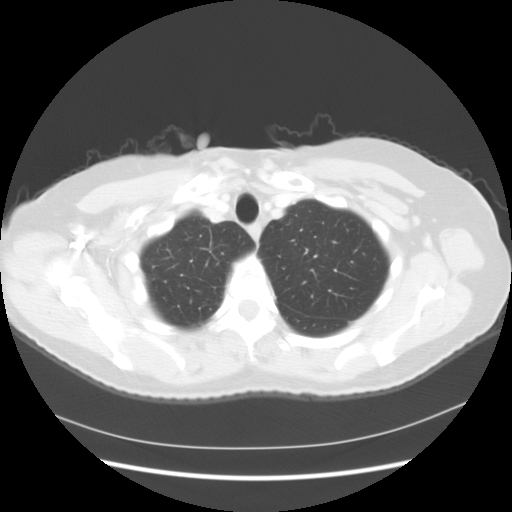
[im 58/63  lung]
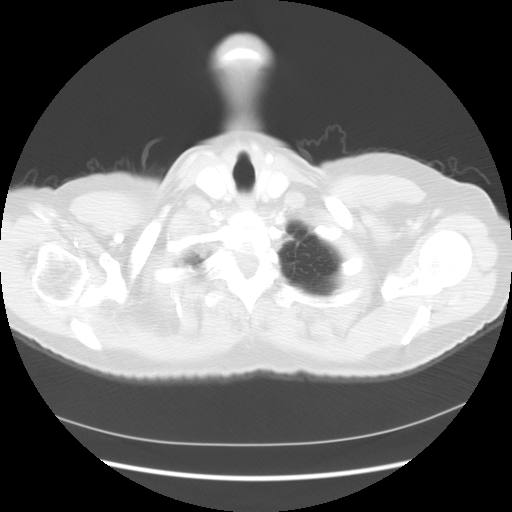

[Series 401: cor · coronal · 0.70mm/px · 3 of 123 slices shown]
[im 25/123  lung]
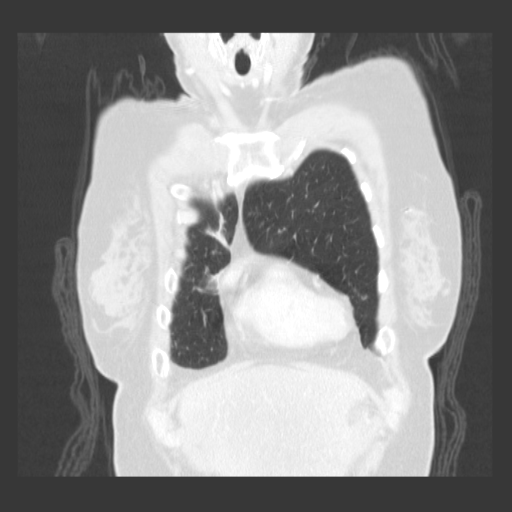
[im 49/123  lung]
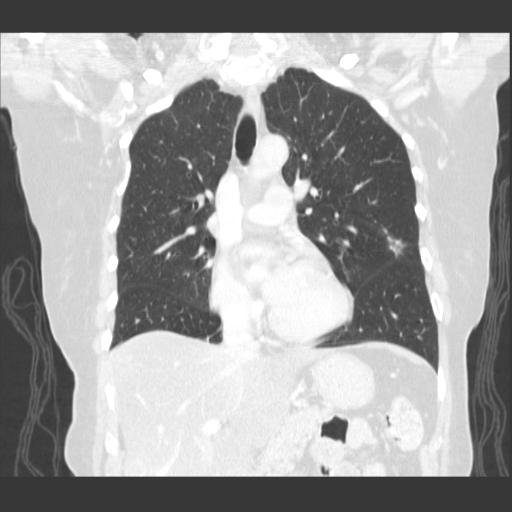
[im 74/123  lung]
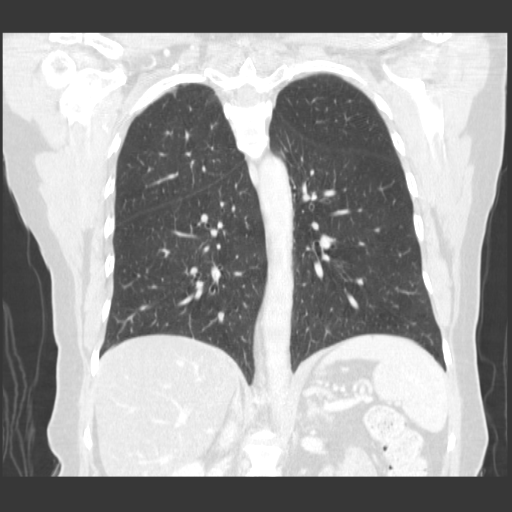

[15 of 36 positions shown; findings below may reference images not displayed]

FINDINGS: Soft tissue / Mediastinum: There is no axillary or subpectoral
lymphadenopathy. No mediastinal or hilar lymphadenopathy. Heart size
is normal. No pericardial effusion.

Lungs / Pleura: There is interstitial and scattered nodular airspace
opacity in the anterior right upper and middle lobe. There is an
area of focal bronchiectasis and nodular opacity in the lingula
(image 40). Spiculated sub solid 9 mm nodular opacity is seen in the
posterior lingula (image 30).

Bones: Bone windows reveal no worrisome lytic or sclerotic osseous
lesions.

Upper Abdomen: Fatty changes are noted diffusely in the liver. No
adrenal nodule or mass.
IMPRESSION: Spiculated left upper lobe ground-glass nodule. Repeat CT chest
without contrast in 3 months is recommended to ensure persistence.
This recommendation follows the consensus statement: Recommendations
for the Management of Subsolid Pulmonary Nodules Detected at CT: A

Areas of probable scarring in the right middle lobe and lingula.
These could also be reassessed at the time of followup CT.
